# Patient Record
Sex: Male | Born: 1990 | Race: Black or African American | Hispanic: No | Marital: Single | Smoking: Current every day smoker
Health system: Southern US, Community
[De-identification: ages and names within clinical notes are randomized; demographics above are authoritative.]

## PROBLEM LIST (undated history)

## (undated) DIAGNOSIS — E119 Type 2 diabetes mellitus without complications: Secondary | ICD-10-CM

## (undated) HISTORY — DX: Type 2 diabetes mellitus without complications: E11.9

---
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
## 2013-05-23 ENCOUNTER — Ambulatory Visit: Payer: Self-pay | Admitting: Dietician

## 2013-06-03 ENCOUNTER — Encounter: Payer: Self-pay | Admitting: *Deleted

## 2013-06-03 ENCOUNTER — Encounter: Payer: 59 | Attending: Endocrinology | Admitting: *Deleted

## 2013-06-03 VITALS — Ht 73.0 in | Wt 145.2 lb

## 2013-06-03 DIAGNOSIS — E109 Type 1 diabetes mellitus without complications: Secondary | ICD-10-CM | POA: Insufficient documentation

## 2013-06-03 DIAGNOSIS — Z794 Long term (current) use of insulin: Secondary | ICD-10-CM | POA: Insufficient documentation

## 2013-06-03 DIAGNOSIS — E1065 Type 1 diabetes mellitus with hyperglycemia: Secondary | ICD-10-CM

## 2013-06-03 DIAGNOSIS — IMO0002 Reserved for concepts with insufficient information to code with codable children: Secondary | ICD-10-CM

## 2013-06-03 DIAGNOSIS — Z713 Dietary counseling and surveillance: Secondary | ICD-10-CM | POA: Insufficient documentation

## 2013-06-03 NOTE — Progress Notes (Signed)
Appt start time: 1530 end time:  1700.  Assessment:  Patient was seen on  06/03/13 for individual diabetes education. Patient here with his brother for diabetes education and carb counting. 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 SMBG twice a day, with reported average of about 250 mg/dl. No hypoglycemia lately. He lives with his mom, she shops and cooks the foods that he eats. He plays basketball with friends about once a week.  Current HbA1c: 14.0%  Preferred Learning Style:   Visual  Learning Readiness:   Not ready  Contemplating  Ready  Change in progress  MEDICATIONS: see list, diabetes medications are Lantus and Humalog  DIETARY INTAKE:  24-hr recall:  B ( AM): no  Snk ( AM): no  L (2 PM): sometimes fast food: hot dog or burger and fries and diet drink OR goes home and makes sandwich with small bag of chips, water  Snk ( PM): chips, nuts  D ( PM): meat, starch, lots of vegetables, occasionally bread, fruit juice or water Snk ( PM): no Beverages: water, juice, diet soda  Usual physical activity: basket ball with friends  Estimated energy needs for weight gain are: 2400 calories 270 g carbohydrates 180 g protein 67 g fat  Progress Towards Goal(s):  In progress.   Nutritional Diagnosis:  NB-1.1 Food and nutrition-related knowledge deficit As related to diabetes control.  As evidenced by A1c of 14.0%.    Intervention:  Nutrition counseling provided.  Discussed diabetes disease process and treatment options.  Discussed physiology of DM 1 vs DM 2 diabetes Discussed role of medications and diet in glucose control  Provided education on macronutrients on glucose levels.  Taught Carb Counting based on Food Group concept as back up method when Food Label or nutrition information is not available. Provided education on carb counting, importance of regularly scheduled meals/snacks, and meal planning  Discussed effects of physical activity on glucose levels and  long-term glucose control.   Reviewed patient medications.  Discussed role of medication on blood glucose and possible side effects  Discussed blood glucose monitoring and interpretation.  Discussed recommended target ranges and individual ranges.    Described short-term complications: hyper- and hypo-glycemia.  Discussed causes,symptoms, and treatment options.  Discussed prevention, detection, and treatment of long-term complications.  Discussed the role of prolonged elevated glucose levels on body systems.  Discussed role of stress on blood glucose levels and discussed strategies to manage psychosocial issues.  Discussed recommendations for long-term diabetes self-care.  Provided checklist for medical, dental, and emotional self-care.  Provided overview information on pump therapy, plan to discuss this in more detail at our next visit.  Teaching Method Utilized: Visual, Auditory and Hands on  Handouts given during visit include: Living Well with Diabetes Carb Counting and Food Label handouts Meal Plan Card Intensive Insulin Handout  DM 1 Support Group Flyer I provided a 2nd set of handouts for his mother who could not attend today's visit per her request.  Barriers to learning/adherence to lifestyle change: financial limitations and lack of education on DM 1 in the past  Diabetes self-care support plan:   [facility] DM 1 support group  Demonstrated degree of understanding via:  Teach Back   Monitoring/Evaluation:  Dietary intake, exercise, SMBG, and body weight in 1 month(s). Patient chooses to call for this appointment so he can check his work schedule.

## 2014-02-17 ENCOUNTER — Encounter (HOSPITAL_COMMUNITY): Payer: Self-pay | Admitting: Emergency Medicine

## 2014-02-17 ENCOUNTER — Emergency Department (HOSPITAL_COMMUNITY)
Admission: EM | Admit: 2014-02-17 | Discharge: 2014-02-17 | Disposition: A | Discharge status: Home / Self Care | Payer: 59 | Attending: Emergency Medicine | Admitting: Emergency Medicine

## 2014-02-17 DIAGNOSIS — Y92410 Unspecified street and highway as the place of occurrence of the external cause: Secondary | ICD-10-CM | POA: Insufficient documentation

## 2014-02-17 DIAGNOSIS — E119 Type 2 diabetes mellitus without complications: Secondary | ICD-10-CM | POA: Insufficient documentation

## 2014-02-17 DIAGNOSIS — S3992XA Unspecified injury of lower back, initial encounter: Secondary | ICD-10-CM | POA: Diagnosis not present

## 2014-02-17 DIAGNOSIS — S199XXA Unspecified injury of neck, initial encounter: Secondary | ICD-10-CM | POA: Diagnosis not present

## 2014-02-17 DIAGNOSIS — S46912A Strain of unspecified muscle, fascia and tendon at shoulder and upper arm level, left arm, initial encounter: Secondary | ICD-10-CM | POA: Diagnosis not present

## 2014-02-17 DIAGNOSIS — Y998 Other external cause status: Secondary | ICD-10-CM | POA: Insufficient documentation

## 2014-02-17 DIAGNOSIS — Z794 Long term (current) use of insulin: Secondary | ICD-10-CM | POA: Insufficient documentation

## 2014-02-17 DIAGNOSIS — Z720 Tobacco use: Secondary | ICD-10-CM | POA: Insufficient documentation

## 2014-02-17 DIAGNOSIS — S46812A Strain of other muscles, fascia and tendons at shoulder and upper arm level, left arm, initial encounter: Secondary | ICD-10-CM

## 2014-02-17 DIAGNOSIS — Y9389 Activity, other specified: Secondary | ICD-10-CM | POA: Insufficient documentation

## 2014-02-17 DIAGNOSIS — S4992XA Unspecified injury of left shoulder and upper arm, initial encounter: Secondary | ICD-10-CM | POA: Diagnosis present

## 2014-02-17 MED ORDER — NAPROXEN 500 MG PO TABS: 500.0000 mg | ORAL_TABLET | Freq: Two times a day (BID) | ORAL | Status: AC

## 2014-02-17 MED ORDER — METHOCARBAMOL 500 MG PO TABS: 500.0000 mg | ORAL_TABLET | Freq: Two times a day (BID) | ORAL | Status: AC
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
## 2014-02-17 NOTE — Discharge Instructions (Signed)
Take muscle relaxer as needed.  Do not drive or operate heavy machinery for 4-6 hours after taking medication. °

## 2017-06-07 ENCOUNTER — Other Ambulatory Visit: Payer: Self-pay | Admitting: Internal Medicine

## 2017-06-07 ENCOUNTER — Ambulatory Visit
Admission: RE | Admit: 2017-06-07 | Discharge: 2017-06-07 | Disposition: A | Discharge status: Home / Self Care | Payer: 59 | Source: Ambulatory Visit | Attending: Internal Medicine | Admitting: Internal Medicine

## 2017-06-07 DIAGNOSIS — M79641 Pain in right hand: Secondary | ICD-10-CM

## 2017-06-30 ENCOUNTER — Emergency Department (HOSPITAL_COMMUNITY)
Admission: EM | Admit: 2017-06-30 | Discharge: 2017-06-30 | Disposition: A | Discharge status: Home / Self Care | Payer: 59 | Attending: Emergency Medicine | Admitting: Emergency Medicine

## 2017-06-30 ENCOUNTER — Encounter (HOSPITAL_COMMUNITY): Payer: Self-pay | Admitting: Emergency Medicine

## 2017-06-30 DIAGNOSIS — E119 Type 2 diabetes mellitus without complications: Secondary | ICD-10-CM | POA: Insufficient documentation

## 2017-06-30 DIAGNOSIS — K0889 Other specified disorders of teeth and supporting structures: Secondary | ICD-10-CM | POA: Insufficient documentation

## 2017-06-30 DIAGNOSIS — Z794 Long term (current) use of insulin: Secondary | ICD-10-CM | POA: Insufficient documentation

## 2017-06-30 DIAGNOSIS — Z79899 Other long term (current) drug therapy: Secondary | ICD-10-CM | POA: Insufficient documentation

## 2017-06-30 DIAGNOSIS — K029 Dental caries, unspecified: Secondary | ICD-10-CM

## 2017-06-30 MED ORDER — NAPROXEN 375 MG PO TABS: 375.0000 mg | ORAL_TABLET | Freq: Two times a day (BID) | ORAL | 0 refills | Status: AC

## 2017-06-30 MED ORDER — PENICILLIN V POTASSIUM 500 MG PO TABS: 500.0000 mg | ORAL_TABLET | Freq: Four times a day (QID) | ORAL | 0 refills | Status: AC

## 2017-06-30 MED ORDER — LIDOCAINE VISCOUS HCL 2 % MT SOLN
15.0000 mL | Freq: Once | OROMUCOSAL | Status: AC
  Administered 2017-06-30: 15 mL via OROMUCOSAL
  Filled 2017-06-30: qty 15

## 2017-06-30 MED ORDER — IBUPROFEN 800 MG PO TABS
800.0000 mg | ORAL_TABLET | Freq: Once | ORAL | Status: AC
  Administered 2017-06-30: 800 mg via ORAL
  Filled 2017-06-30: qty 1

## 2017-06-30 MED ORDER — PENICILLIN V POTASSIUM 250 MG PO TABS
500.0000 mg | ORAL_TABLET | Freq: Once | ORAL | Status: AC
  Administered 2017-06-30: 500 mg via ORAL
  Filled 2017-06-30: qty 2

## 2017-06-30 NOTE — ED Provider Notes (Signed)
[facility] EMERGENCY DEPARTMENT Provider Note   CSN: [id number] Arrival date & time: 06/30/17  0023     History   Chief Complaint Chief Complaint  Patient presents with  . Dental Pain    HPI [place] K Hawthorne is a 27 y.o. male.  The history is provided by the patient.  Dental Pain   This is a new problem. The current episode started 12 to 24 hours ago. The problem occurs constantly. The problem has not changed since onset.The pain is severe. He has tried nothing for the symptoms. The treatment provided no relief.    Past Medical History:  Diagnosis Date  . Diabetes mellitus without complication (HCC)     There are no active problems to display for this patient.   History reviewed. No pertinent surgical history.      Home Medications    Prior to Admission medications   Medication Sig Start Date End Date Taking? Authorizing Provider  insulin glargine (LANTUS) 100 UNIT/ML injection Inject 15 Units into the skin at bedtime.    [provider]  insulin lispro (HUMALOG) 100 UNIT/ML injection Inject 15-20 Units into the skin 3 (three) times daily with meals.    [provider]  methocarbamol (ROBAXIN) 500 MG tablet Take 1 tablet (500 mg total) by mouth 2 (two) times daily. 02/17/14   Santiago Glad, PA-C  naproxen (NAPROSYN) 500 MG tablet Take 1 tablet (500 mg total) by mouth 2 (two) times daily. 02/17/14   Santiago Glad, PA-C    Family History No family history on file.  Social History Social History   Tobacco Use  . Smoking status: Current Every Day Smoker    Packs/day: 0.25    Types: Cigarettes  . Smokeless tobacco: Never Used  Substance Use Topics  . Alcohol use: Yes    Comment: Socially   . Drug use: Not Currently     Allergies   Patient has no known allergies.   Review of Systems Review of Systems  Constitutional: Negative for fever.  HENT: Positive for dental problem. Negative for drooling and facial  swelling.   All other systems reviewed and are negative.    Physical Exam Updated Vital Signs BP (!) 148/109 (BP Location: Left Arm)   Pulse 79   Temp 97.9 F (36.6 C) (Oral)   Resp 16   Ht  (1.854 m)   Wt 68 kg (150 lb)   SpO2 100%   BMI 19.79 kg/m   Physical Exam  Constitutional: He is oriented to person, place, and time. He appears well-developed and well-nourished. No distress.  HENT:  Head: Normocephalic and atraumatic.  Mouth/Throat: No oropharyngeal exudate.    Eyes: EOM are normal.  Neck: Normal range of motion. Neck supple. No tracheal deviation present.  Cardiovascular: Normal rate, regular rhythm, normal heart sounds and intact distal pulses.  Pulmonary/Chest: Effort normal and breath sounds normal. No stridor. He has no wheezes. He has no rales.  Abdominal: Soft. Bowel sounds are normal. He exhibits no mass. There is no tenderness. There is no rebound and no guarding.  Musculoskeletal: Normal range of motion.  Neurological: He is alert and oriented to person, place, and time. He displays normal reflexes.  Skin: Skin is warm and dry. Capillary refill takes less than 2 seconds.  Psychiatric: He has a normal mood and affect.     ED Treatments / Results   Procedures Procedures (including critical care time)  Medications Ordered in ED Medications  penicillin v  potassium (VEETID) tablet 500 mg (has no administration in time range)  ibuprofen (ADVIL,MOTRIN) tablet 800 mg (has no administration in time range)  lidocaine (XYLOCAINE) 2 % viscous mouth solution 15 mL (has no administration in time range)       Final Clinical Impressions(s) / ED Diagnoses   Well appearing, follow up with dentistry.  We do not prescribe narcotics for dental pain.  Follow up with your dentist.     Return for weakness, numbness, changes in vision or speech, fevers >100.4 unrelieved by medication, shortness of breath, intractable vomiting, or diarrhea, abdominal pain,  Inability to tolerate liquids or food, cough, altered mental status or any concerns. No signs of systemic illness or infection. The patient is nontoxic-appearing on exam and vital signs are within normal limits.   I have reviewed the triage vital signs and the nursing notes. Pertinent labs &imaging results that were available during my care of the patient were reviewed by me and considered in my medical decision making (see chart for details).  After history, exam, and medical workup I feel the patient has been appropriately medically screened and is safe for discharge home. Pertinent diagnoses were discussed with the patient. Patient was given return precautions.       Kimberle Stanfill, MD 06/30/17 1478

## 2017-06-30 NOTE — ED Triage Notes (Signed)
Pt reports L upper dental pain X1 day. Noted to have cracked tooth.

## 2019-01-28 ENCOUNTER — Encounter (HOSPITAL_COMMUNITY): Payer: Self-pay | Admitting: *Deleted

## 2019-01-28 ENCOUNTER — Emergency Department (HOSPITAL_COMMUNITY): Payer: BLUE CROSS/BLUE SHIELD

## 2019-01-28 ENCOUNTER — Other Ambulatory Visit: Payer: Self-pay

## 2019-01-28 ENCOUNTER — Emergency Department (HOSPITAL_COMMUNITY)
Admission: EM | Admit: 2019-01-28 | Discharge: 2019-01-28 | Disposition: A | Discharge status: Home / Self Care | Payer: BLUE CROSS/BLUE SHIELD | Attending: Emergency Medicine | Admitting: Emergency Medicine

## 2019-01-28 DIAGNOSIS — R1032 Left lower quadrant pain: Secondary | ICD-10-CM | POA: Diagnosis present

## 2019-01-28 DIAGNOSIS — N202 Calculus of kidney with calculus of ureter: Secondary | ICD-10-CM | POA: Diagnosis not present

## 2019-01-28 DIAGNOSIS — N134 Hydroureter: Secondary | ICD-10-CM | POA: Diagnosis not present

## 2019-01-28 DIAGNOSIS — N130 Hydronephrosis with ureteropelvic junction obstruction: Secondary | ICD-10-CM | POA: Diagnosis not present

## 2019-01-28 DIAGNOSIS — F17210 Nicotine dependence, cigarettes, uncomplicated: Secondary | ICD-10-CM | POA: Diagnosis not present

## 2019-01-28 DIAGNOSIS — R319 Hematuria, unspecified: Secondary | ICD-10-CM | POA: Diagnosis not present

## 2019-01-28 DIAGNOSIS — Z794 Long term (current) use of insulin: Secondary | ICD-10-CM | POA: Insufficient documentation

## 2019-01-28 DIAGNOSIS — E109 Type 1 diabetes mellitus without complications: Secondary | ICD-10-CM | POA: Insufficient documentation

## 2019-01-28 DIAGNOSIS — N201 Calculus of ureter: Secondary | ICD-10-CM

## 2019-01-28 LAB — CBC
HCT: 38.7 % — ABNORMAL LOW (ref 39.0–52.0)
Hemoglobin: 13.1 g/dL (ref 13.0–17.0)
MCH: 31.2 pg (ref 26.0–34.0)
MCHC: 33.9 g/dL (ref 30.0–36.0)
MCV: 92.1 fL (ref 80.0–100.0)
Platelets: 244 10*3/uL (ref 150–400)
RBC: 4.2 MIL/uL — ABNORMAL LOW (ref 4.22–5.81)
RDW: 12.5 % (ref 11.5–15.5)
WBC: 6.6 10*3/uL (ref 4.0–10.5)
nRBC: 0 % (ref 0.0–0.2)

## 2019-01-28 LAB — URINALYSIS, ROUTINE W REFLEX MICROSCOPIC
Bacteria, UA: NONE SEEN
Bilirubin Urine: NEGATIVE
Glucose, UA: 50 mg/dL — AB
Ketones, ur: NEGATIVE mg/dL
Leukocytes,Ua: NEGATIVE
Nitrite: NEGATIVE
Protein, ur: 100 mg/dL — AB
RBC / HPF: >50 RBC/hpf — ABNORMAL HIGH (ref 0–5)
Specific Gravity, Urine: 1.026 (ref 1.005–1.030)
pH: 5 (ref 5.0–8.0)

## 2019-01-28 LAB — CBG MONITORING, ED: Glucose-Capillary: 263 mg/dL — ABNORMAL HIGH (ref 70–99)

## 2019-01-28 LAB — BASIC METABOLIC PANEL
Anion gap: 10 (ref 5–15)
BUN: 16 mg/dL (ref 6–20)
CO2: 22 mmol/L (ref 22–32)
Calcium: 9.3 mg/dL (ref 8.9–10.3)
Chloride: 104 mmol/L (ref 98–111)
Creatinine, Ser: 1.42 mg/dL — ABNORMAL HIGH (ref 0.61–1.24)
GFR calc Af Amer: >60 mL/min (ref >60)
GFR calc non Af Amer: >60 mL/min (ref >60)
Glucose, Bld: 250 mg/dL — ABNORMAL HIGH (ref 70–99)
Potassium: 4.1 mmol/L (ref 3.5–5.1)
Sodium: 136 mmol/L (ref 135–145)

## 2019-01-28 MED ORDER — ONDANSETRON HCL 4 MG/2ML IJ SOLN
4.0000 mg | Freq: Once | INTRAMUSCULAR | Status: AC
  Administered 2019-01-28: 13:00:00 4 mg via INTRAVENOUS
  Filled 2019-01-28: qty 2

## 2019-01-28 MED ORDER — FENTANYL CITRATE (PF) 100 MCG/2ML IJ SOLN
50.0000 ug | Freq: Once | INTRAMUSCULAR | Status: AC
  Administered 2019-01-28: 50 ug via INTRAVENOUS
  Filled 2019-01-28: qty 2

## 2019-01-28 MED ORDER — ONDANSETRON HCL 4 MG PO TABS: 4.0000 mg | ORAL_TABLET | Freq: Three times a day (TID) | ORAL | 0 refills | Status: AC | PRN

## 2019-01-28 MED ORDER — TAMSULOSIN HCL 0.4 MG PO CAPS: 0.4000 mg | ORAL_CAPSULE | Freq: Two times a day (BID) | ORAL | 0 refills | Status: AC

## 2019-01-28 MED ORDER — OXYCODONE-ACETAMINOPHEN 5-325 MG PO TABS: 1.0000 | ORAL_TABLET | Freq: Four times a day (QID) | ORAL | 0 refills | Status: AC | PRN

## 2019-01-28 NOTE — ED Triage Notes (Signed)
C/o hematuria onset several days ago c/o lower back pain this am.

## 2019-01-28 NOTE — ED Provider Notes (Signed)
[facility]   CSN: [id number] Arrival date & time: 01/28/19  1041     History Chief Complaint  Patient presents with  . Back Pain    Ralph Pierce is a 28 y.o. male  The history is provided by the patient. No language interpreter was used.  Flank Pain This is a new problem. The current episode started 1 to 2 hours ago. The problem occurs constantly. The problem has not changed since onset.Pertinent negatives include no chest pain, no abdominal pain, no headaches and no shortness of breath. Nothing aggravates the symptoms. Nothing relieves the symptoms. He has tried nothing for the symptoms.  Hematuria This is a new problem. The current episode started more than 2 days ago. The problem occurs constantly. The problem has not changed since onset.Pertinent negatives include no chest pain, no abdominal pain, no headaches and no shortness of breath.   28 year old male presents emergency department chief complaint of flank pain.  She has a past medical history of type 1 diabetes mellitus.  He has had intermittent gross hematuria over the past 2 to 3 days.  This morning he awoke with severe left flank pain radiating into the left lower quadrant of the abdomen.  He describes the pain as sharp, aching, constant, colicky, severe.  He had no nausea or vomiting.  He has never had anything like this before.  He denies a history of kidney stones.  He denies fever, chills.  He denies use of blood thinners.     Past Medical History:  Diagnosis Date  . Diabetes mellitus without complication (HCC)     There are no problems to display for this patient.   History reviewed. No pertinent surgical history.     No family history on file.  Social History   Tobacco Use  . Smoking status: Current Every Day Smoker    Packs/day: 0.25    Types: Cigarettes  . Smokeless tobacco: Never Used  Substance Use Topics  . Alcohol use: Yes    Comment:  Socially   . Drug use: Not Currently    Home Medications Prior to Admission medications   Medication Sig Start Date End Date Taking? Authorizing Provider  insulin glargine (LANTUS) 100 UNIT/ML injection Inject 15 Units into the skin at bedtime.    [provider]  insulin lispro (HUMALOG) 100 UNIT/ML injection Inject 15-20 Units into the skin 3 (three) times daily with meals.    [provider]  methocarbamol (ROBAXIN) 500 MG tablet Take 1 tablet (500 mg total) by mouth 2 (two) times daily. 02/17/14   Santiago GladLaisure, Heather, PA-C  naproxen (NAPROSYN) 375 MG tablet Take 1 tablet (375 mg total) by mouth 2 (two) times daily. 06/30/17   Palumbo, April, MD  naproxen (NAPROSYN) 500 MG tablet Take 1 tablet (500 mg total) by mouth 2 (two) times daily. 02/17/14   Santiago GladLaisure, Heather, PA-C    Allergies    Patient has no known allergies.  Review of Systems   Review of Systems  Constitutional: Negative for chills and fever.  HENT: Negative.   Eyes: Negative.   Respiratory: Negative for shortness of breath.   Cardiovascular: Negative for chest pain.  Gastrointestinal: Negative for abdominal pain and vomiting.  Genitourinary: Positive for flank pain and hematuria. Negative for discharge, frequency, penile pain, penile swelling, testicular pain and urgency.  Musculoskeletal: Negative for back pain.  Neurological: Negative for headaches.  Psychiatric/Behavioral: Negative.   All other systems reviewed and are  negative.   Physical Exam Updated Vital Signs BP (!) 160/104 (BP Location: Right Arm)   Pulse 96   Temp 97.9 F (36.6 C) (Oral)   Resp 14   Ht 6\' 1"  (1.854 m)   Wt 68 kg   SpO2 97%   BMI 19.79 kg/m   Physical Exam Vitals and nursing note reviewed.  Constitutional:      General: He is not in acute distress.    Appearance: He is well-developed. He is not diaphoretic.  HENT:     Head: Normocephalic and atraumatic.  Eyes:     General: No scleral icterus.    Extraocular  Movements: Extraocular movements intact.     Conjunctiva/sclera: Conjunctivae normal.     Pupils: Pupils are equal, round, and reactive to light.  Cardiovascular:     Rate and Rhythm: Normal rate and regular rhythm.     Heart sounds: Normal heart sounds.  Pulmonary:     Effort: Pulmonary effort is normal. No respiratory distress.     Breath sounds: Normal breath sounds.  Abdominal:     Palpations: Abdomen is soft.     Tenderness: There is no abdominal tenderness. There is no right CVA tenderness or left CVA tenderness.  Musculoskeletal:     Cervical back: Normal range of motion and neck supple.  Skin:    General: Skin is warm and dry.  Neurological:     Mental Status: He is alert.  Psychiatric:        Behavior: Behavior normal.     ED Results / Procedures / Treatments   Labs (all labs ordered are listed, but only abnormal results are displayed) Labs Reviewed  URINALYSIS, ROUTINE W REFLEX MICROSCOPIC  BASIC METABOLIC PANEL  CBC    EKG None  Radiology No results found.  Procedures Procedures (including critical care time)  Medications Ordered in ED Medications - No data to display  ED Course  I have reviewed the triage vital signs and the nursing notes.  Pertinent labs & imaging results that were available during my care of the patient were reviewed by me and considered in my medical decision making (see chart for details).    MDM Rules/Calculators/A&P                       Given the large differential diagnosis for K Tadros, the decision making in this case is of high complexity. I have reviewed the patient's labs which show elevated blood glucose in type 1 diabetic likely acute phase reaction. No leukocytosis, normal hgb. Creatinine slightly elevated- likely from obstruction- encouraged need to to follow up for reevaluation in the next few days. Ua shows large blood without evidence of infections I personally reviewed the patient's images which show 66mm  ureterolithiasis with obstruction on the left my interpretation. After evaluating all of the data points in this case, the presentation of 11m K [place] is consistent with ureterolithiasis with renal colic.  The presentation NOT consistent with an infected stone, nephric abscess, sepsis, or renal failure.  Similarly, this presentation is NOT consistent with AAA; Mesenteric Ischemia; Bowel Perforation; Bowel Obstruction; Sigmoid Volvulus; Diverticulitis; Appendicitis; Peritonitis; Cholecystitis, ascending cholangitis or other gallbladder disease; perforated ulcer; significant GI bleeding, splenic rupture/infarction; Hepatic abscess; or other surgical/acute abdomen.  Similarly, this presentation is NOT consistent with ACS or Myocardial Ischemia; Pulmonary Embolism; fistula; incarcerated hernia; Pancreatitis, Aortic Dissection; Diabetic Ketoacidosis; Ischemic colitis; Psoas or other abscess; Methanol poisoning; Heavy metal toxicity; or porphyria.  Similarly,  this presentation is NOT consistent with acute coronary syndrome, pulmonary embolism, dissection, borhaave's, arrythmia, pneumothorax, cardiac tamponade, or other emergent cardiopulmonary condition.  Similarly, this presentation is NOT consistent with pyelonephritis, urinary infection, pneumonia, or other focal bacterial infection.  Moreover this case is NOT consistent with testicular torsion, prostatitis, hernia, STI, or other testicular issue.  Strict return and follow-up precautions have been given by me personally or by detailed written instruction verbalized by nursing staff using the teach back method. to the patient/family/caregiver(s).    Final Clinical Impression(s) / ED Diagnoses Final diagnoses:  None    Rx / DC Orders ED Discharge Orders    None       Margarita Mail, PA-C 01/29/19 1047    Quintella Reichert, MD 01/30/19 [phone]

## 2019-01-28 NOTE — Discharge Instructions (Addendum)
You were given narcotic and or sedative medications while in the emergency department. Do not drive. Do not use machinery or power tools. Do not sign legal documents. Do not drink alcohol. Do not take sleeping pills. Do not supervise children by yourself. Do not participate in activities that require climbing or being in high places.   Contact a health care provider if: You have a fever or chills. Your urine smells bad or looks cloudy. You have pain or burning when you pass urine. Get help right away if: Your flank pain or groin pain suddenly worsens. You become confused or disoriented or you lose consciousness.

## 2020-09-10 IMAGING — CT CT RENAL STONE PROTOCOL
2 of 4 series · 16 of 46 positions shown, 18 images · non-contrast
Comparison: None.

CLINICAL DATA: Left-sided flank pain for 2 days.  Hematuria.

EXAM:
CT ABDOMEN AND PELVIS WITHOUT CONTRAST
TECHNIQUE: Multidetector CT imaging of the abdomen and pelvis was performed
following the standard protocol without IV contrast.

[Series 3: renal stone 5.0 · axial · 0.71mm/px · z∈[-513,-98]mm · 13 of 91 slices shown, 15 images]
[im 4/91  soft-tissue]
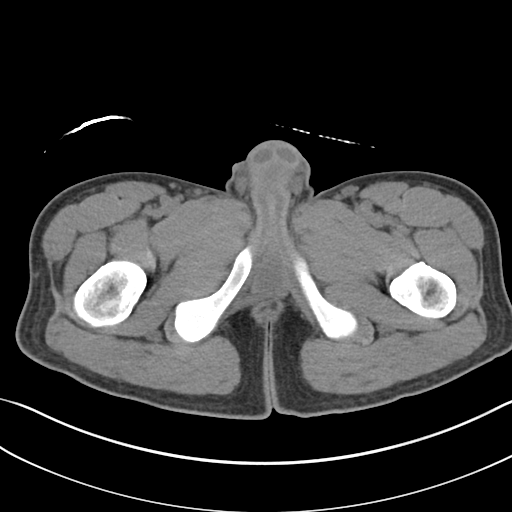
[im 4/91  bone]
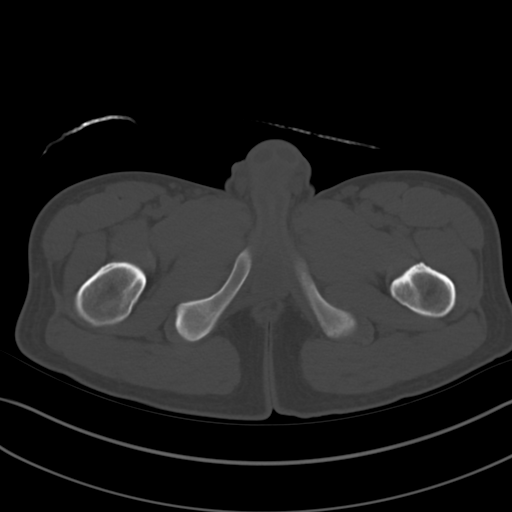
[im 11/91  soft-tissue]
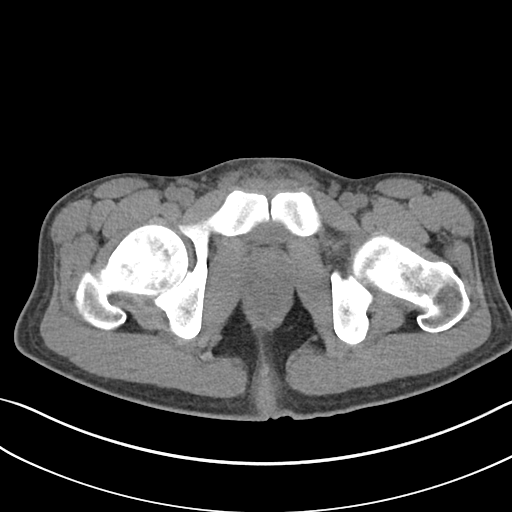
[im 19/91  soft-tissue]
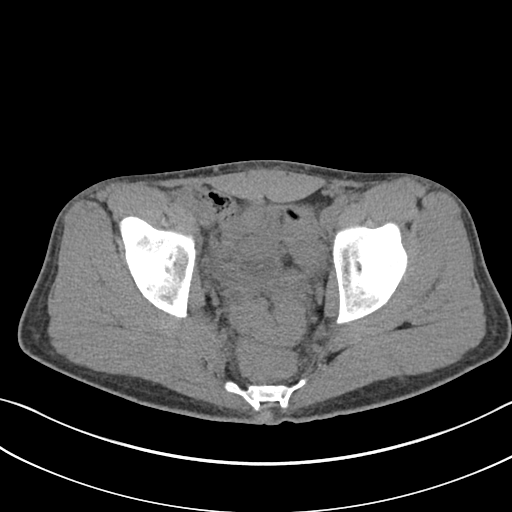
[im 26/91  soft-tissue]
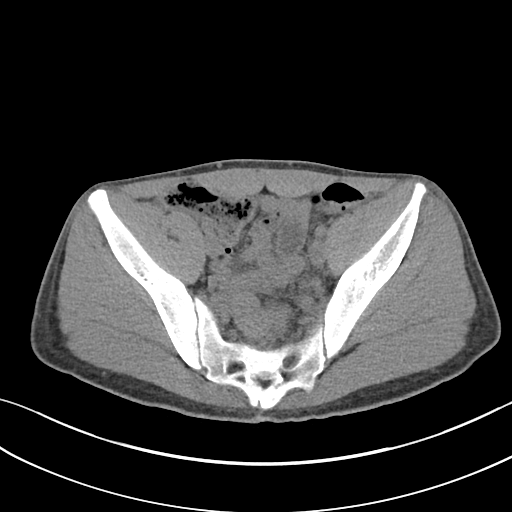
[im 33/91  soft-tissue]
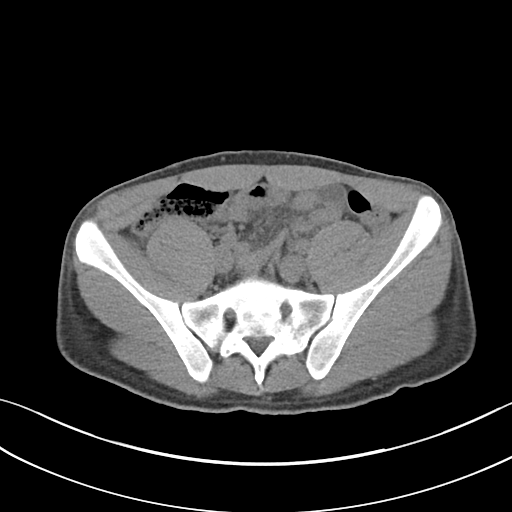
[im 40/91  soft-tissue]
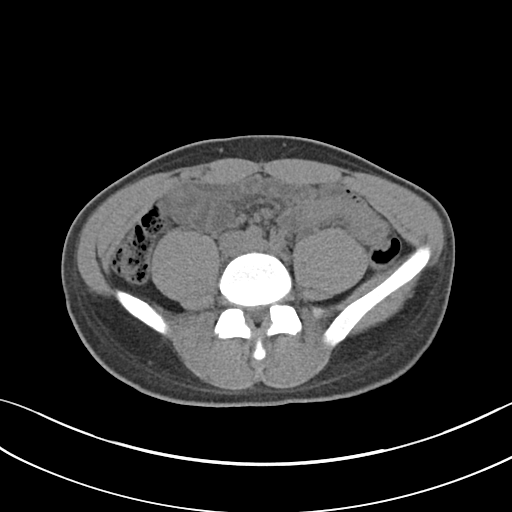
[im 47/91  soft-tissue]
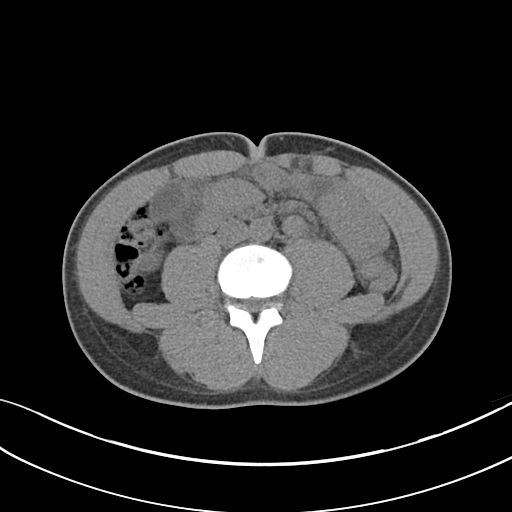
[im 51/91  soft-tissue]
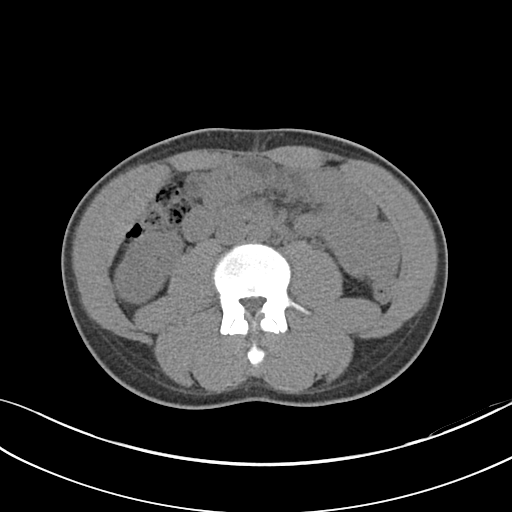
[im 58/91  soft-tissue]
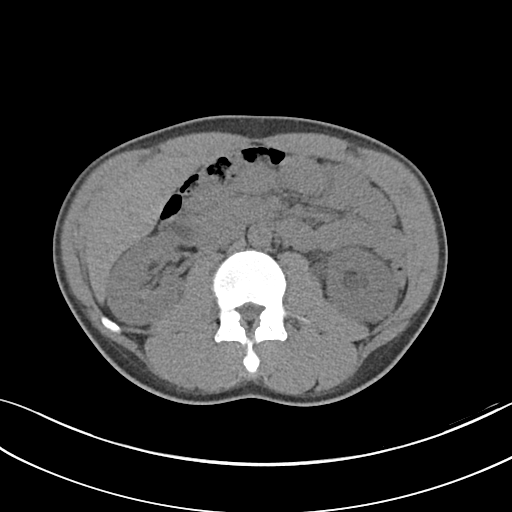
[im 58/91  bone]
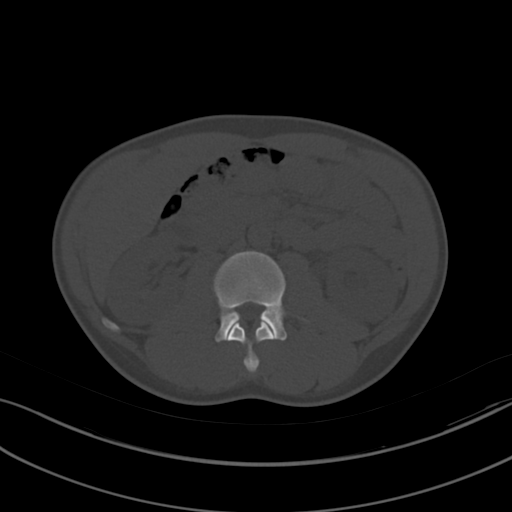
[im 65/91  soft-tissue]
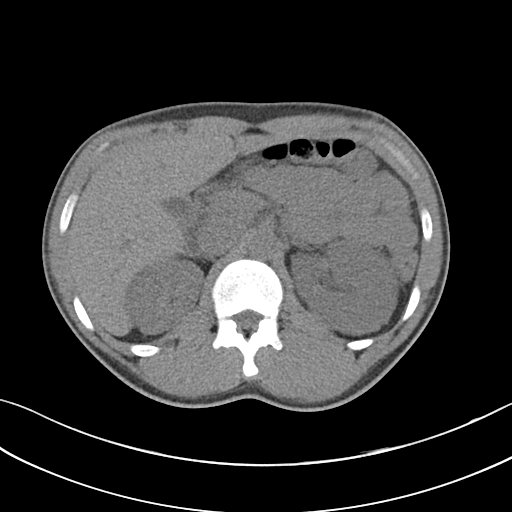
[im 73/91  soft-tissue]
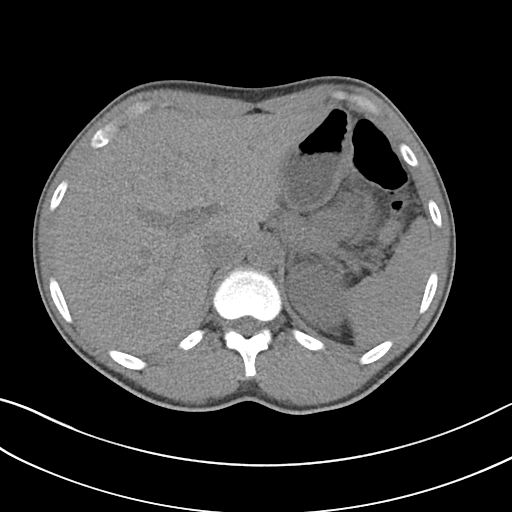
[im 80/91  soft-tissue]
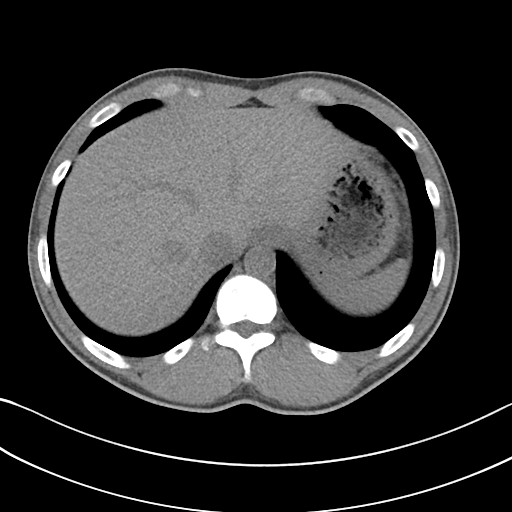
[im 87/91  soft-tissue]
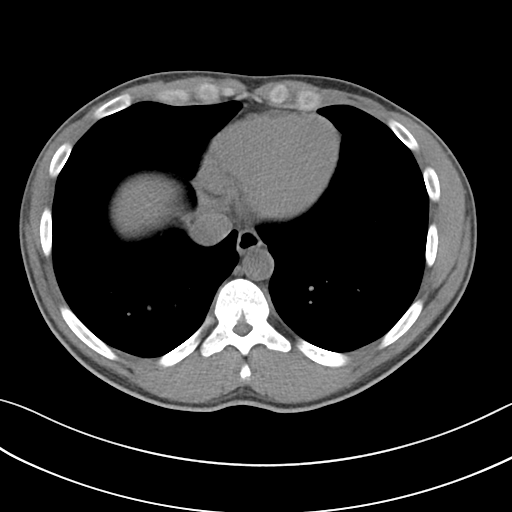

[Series 5: renal stone 3.0 cor · coronal · 0.62mm/px · 3 of 95 slices shown]
[im 32/95  soft-tissue]
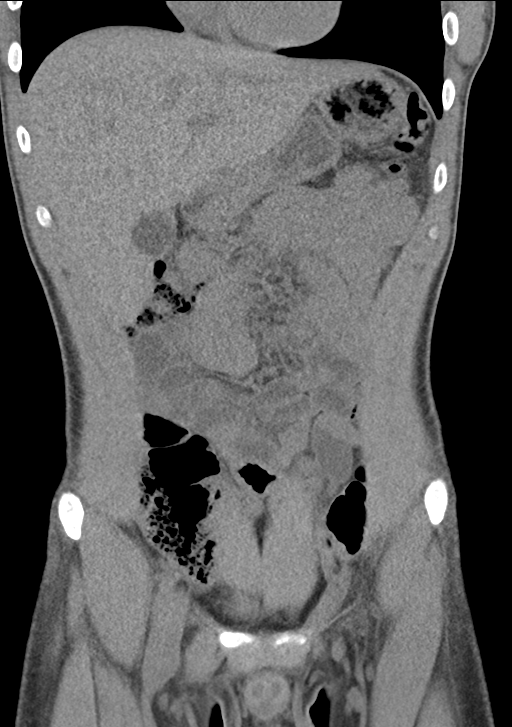
[im 42/95  soft-tissue]
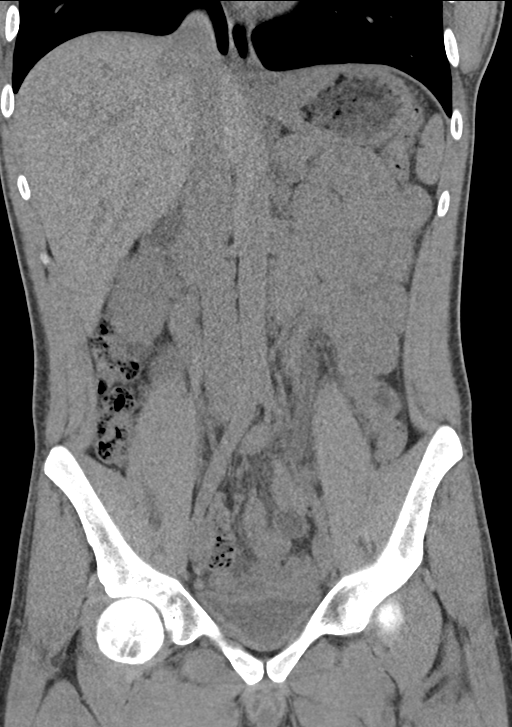
[im 53/95  soft-tissue]
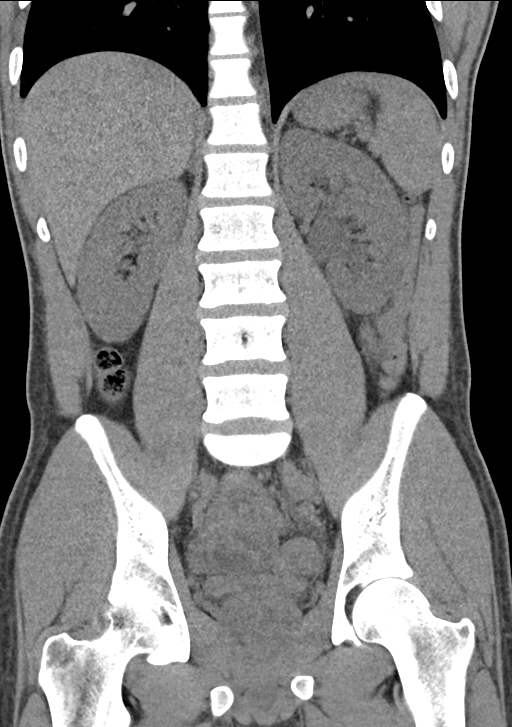

[16 of 46 positions shown; findings below may reference images not displayed]

FINDINGS: Lower chest: No acute findings.

Hepatobiliary: No mass visualized on this unenhanced exam.
Gallbladder is unremarkable. No evidence of biliary ductal
dilatation.

Pancreas: No mass or inflammatory process visualized on this
unenhanced exam.

Spleen:  Within normal limits in size.

Adrenals/Urinary tract: Several tiny 1-2 mm renal calculi are seen
bilaterally. Mild left hydroureteronephrosis is seen due to a 4 mm
calculus in the mid pelvic portion of the left ureter.

Stomach/Bowel: No evidence of obstruction, inflammatory process, or
abnormal fluid collections.

Vascular/Lymphatic: No pathologically enlarged lymph nodes
identified. No evidence of abdominal aortic aneurysm.

Reproductive:  No mass or other significant abnormality.

Other:  None.

Musculoskeletal:  No suspicious bone lesions identified.
IMPRESSION: Mild left hydroureteronephrosis due to 4 mm calculus in the mid
pelvic portion of the left ureter.

Bilateral nephrolithiasis.

## 2022-05-26 ENCOUNTER — Emergency Department (HOSPITAL_COMMUNITY): Payer: BLUE CROSS/BLUE SHIELD

## 2022-05-26 ENCOUNTER — Other Ambulatory Visit: Payer: Self-pay

## 2022-05-26 ENCOUNTER — Emergency Department (HOSPITAL_COMMUNITY)
Admission: EM | Admit: 2022-05-26 | Discharge: 2022-05-27 | Disposition: A | Discharge status: Home / Self Care | Payer: BLUE CROSS/BLUE SHIELD | Attending: Emergency Medicine | Admitting: Emergency Medicine

## 2022-05-26 ENCOUNTER — Encounter (HOSPITAL_COMMUNITY): Payer: Self-pay

## 2022-05-26 DIAGNOSIS — L089 Local infection of the skin and subcutaneous tissue, unspecified: Secondary | ICD-10-CM

## 2022-05-26 DIAGNOSIS — Z4800 Encounter for change or removal of nonsurgical wound dressing: Secondary | ICD-10-CM | POA: Insufficient documentation

## 2022-05-26 DIAGNOSIS — E109 Type 1 diabetes mellitus without complications: Secondary | ICD-10-CM | POA: Diagnosis not present

## 2022-05-26 DIAGNOSIS — X16XXXD Contact with hot heating appliances, radiators and pipes, subsequent encounter: Secondary | ICD-10-CM | POA: Insufficient documentation

## 2022-05-26 DIAGNOSIS — S99921D Unspecified injury of right foot, subsequent encounter: Secondary | ICD-10-CM | POA: Diagnosis present

## 2022-05-26 DIAGNOSIS — T25021D Burn of unspecified degree of right foot, subsequent encounter: Secondary | ICD-10-CM | POA: Insufficient documentation

## 2022-05-26 DIAGNOSIS — Z794 Long term (current) use of insulin: Secondary | ICD-10-CM | POA: Diagnosis not present

## 2022-05-26 DIAGNOSIS — T25021S Burn of unspecified degree of right foot, sequela: Secondary | ICD-10-CM | POA: Insufficient documentation

## 2022-05-26 LAB — CBC WITH DIFFERENTIAL/PLATELET
Abs Immature Granulocytes: 0.01 10*3/uL (ref 0.00–0.07)
Basophils Absolute: 0 10*3/uL (ref 0.0–0.1)
Basophils Relative: 0 %
Eosinophils Absolute: 0.1 10*3/uL (ref 0.0–0.5)
Eosinophils Relative: 1 %
HCT: 38.5 % — ABNORMAL LOW (ref 39.0–52.0)
Hemoglobin: 13 g/dL (ref 13.0–17.0)
Immature Granulocytes: 0 %
Lymphocytes Relative: 38 %
Lymphs Abs: 2.6 10*3/uL (ref 0.7–4.0)
MCH: 29.9 pg (ref 26.0–34.0)
MCHC: 33.8 g/dL (ref 30.0–36.0)
MCV: 88.5 fL (ref 80.0–100.0)
Monocytes Absolute: 0.5 10*3/uL (ref 0.1–1.0)
Monocytes Relative: 7 %
Neutro Abs: 3.7 10*3/uL (ref 1.7–7.7)
Neutrophils Relative %: 54 %
Platelets: 353 10*3/uL (ref 150–400)
RBC: 4.35 MIL/uL (ref 4.22–5.81)
RDW: 11.9 % (ref 11.5–15.5)
WBC: 6.9 10*3/uL (ref 4.0–10.5)
nRBC: 0 % (ref 0.0–0.2)

## 2022-05-26 LAB — COMPREHENSIVE METABOLIC PANEL
ALT: 12 U/L (ref 0–44)
AST: 14 U/L — ABNORMAL LOW (ref 15–41)
Albumin: 3.9 g/dL (ref 3.5–5.0)
Alkaline Phosphatase: 75 U/L (ref 38–126)
Anion gap: 9 (ref 5–15)
BUN: 18 mg/dL (ref 6–20)
CO2: 26 mmol/L (ref 22–32)
Calcium: 9.2 mg/dL (ref 8.9–10.3)
Chloride: 100 mmol/L (ref 98–111)
Creatinine, Ser: 1.35 mg/dL — ABNORMAL HIGH (ref 0.61–1.24)
GFR, Estimated: >60 mL/min (ref >60)
Glucose, Bld: 245 mg/dL — ABNORMAL HIGH (ref 70–99)
Potassium: 3.7 mmol/L (ref 3.5–5.1)
Sodium: 135 mmol/L (ref 135–145)
Total Bilirubin: 0.6 mg/dL (ref 0.3–1.2)
Total Protein: 8.1 g/dL (ref 6.5–8.1)

## 2022-05-26 LAB — LACTIC ACID, PLASMA: Lactic Acid, Venous: 1.6 mmol/L (ref 0.5–1.9)

## 2022-05-26 MED ORDER — PIPERACILLIN-TAZOBACTAM 3.375 G IVPB 30 MIN
3.3750 g | Freq: Once | INTRAVENOUS | Status: AC
  Administered 2022-05-26: 3.375 g via INTRAVENOUS
  Filled 2022-05-26: qty 50

## 2022-05-26 MED ORDER — VANCOMYCIN HCL IN DEXTROSE 1-5 GM/200ML-% IV SOLN
1000.0000 mg | Freq: Once | INTRAVENOUS | Status: AC
  Administered 2022-05-26: 1000 mg via INTRAVENOUS
  Filled 2022-05-26 (×2): qty 200
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
## 2022-05-27 LAB — CULTURE, BLOOD (ROUTINE X 2)
Culture: NO GROWTH
Special Requests: ADEQUATE

## 2022-05-27 MED ORDER — DOXYCYCLINE HYCLATE 100 MG PO CAPS: 100.0000 mg | ORAL_CAPSULE | Freq: Two times a day (BID) | ORAL | 0 refills | Status: AC

## 2022-05-27 NOTE — Discharge Instructions (Addendum)
Take the prescribed medication as directed.  Continue wound care at home, continue using silvadene cream twice daily. Follow-up with Dr. Lajoyce Corners on Monday-- call his office in the morning to get this scheduled, they are expecting you. Return to the ED for new or worsening symptoms-- increased drainage, fever, toes turning black, etc.

## 2022-05-28 LAB — CULTURE, BLOOD (ROUTINE X 2)

## 2022-05-29 LAB — CULTURE, BLOOD (ROUTINE X 2)

## 2022-05-30 ENCOUNTER — Ambulatory Visit: Payer: BLUE CROSS/BLUE SHIELD | Admitting: Orthopedic Surgery

## 2022-05-30 DIAGNOSIS — T25221A Burn of second degree of right foot, initial encounter: Secondary | ICD-10-CM

## 2022-05-30 LAB — CULTURE, BLOOD (ROUTINE X 2)

## 2022-05-31 ENCOUNTER — Encounter: Payer: Self-pay | Admitting: Orthopedic Surgery

## 2022-05-31 LAB — CULTURE, BLOOD (ROUTINE X 2): Culture: NO GROWTH
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
## 2022-06-06 ENCOUNTER — Encounter: Payer: Self-pay | Admitting: Orthopedic Surgery

## 2022-06-06 ENCOUNTER — Ambulatory Visit: Payer: BLUE CROSS/BLUE SHIELD | Admitting: Orthopedic Surgery

## 2022-06-06 DIAGNOSIS — T25221A Burn of second degree of right foot, initial encounter: Secondary | ICD-10-CM | POA: Diagnosis not present
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
## 2022-06-21 ENCOUNTER — Encounter: Payer: Self-pay | Admitting: Family

## 2022-06-21 ENCOUNTER — Ambulatory Visit: Payer: BLUE CROSS/BLUE SHIELD | Admitting: Family

## 2022-06-21 DIAGNOSIS — T25221A Burn of second degree of right foot, initial encounter: Secondary | ICD-10-CM
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
## 2022-07-12 ENCOUNTER — Ambulatory Visit: Payer: BLUE CROSS/BLUE SHIELD | Admitting: Family

## 2022-07-12 ENCOUNTER — Encounter: Payer: Self-pay | Admitting: Family

## 2022-07-12 DIAGNOSIS — T25221A Burn of second degree of right foot, initial encounter: Secondary | ICD-10-CM | POA: Insufficient documentation

## 2022-07-12 DIAGNOSIS — T25221D Burn of second degree of right foot, subsequent encounter: Secondary | ICD-10-CM | POA: Diagnosis not present
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
## 2022-08-16 ENCOUNTER — Ambulatory Visit: Payer: BLUE CROSS/BLUE SHIELD | Admitting: Family

## 2022-08-16 ENCOUNTER — Encounter: Payer: Self-pay | Admitting: Family

## 2022-08-16 DIAGNOSIS — T25221D Burn of second degree of right foot, subsequent encounter: Secondary | ICD-10-CM
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
